# Patient Record
Sex: Male | Born: 1980 | Race: White | Hispanic: No | Marital: Single | Smoking: Former smoker
Health system: Southern US, Community
[De-identification: ages and names within clinical notes are randomized; demographics above are authoritative.]

## PROBLEM LIST (undated history)

## (undated) DIAGNOSIS — M069 Rheumatoid arthritis, unspecified: Secondary | ICD-10-CM

## (undated) DIAGNOSIS — M109 Gout, unspecified: Secondary | ICD-10-CM

## (undated) HISTORY — PX: HERNIA REPAIR: SHX51

## (undated) HISTORY — PX: WISDOM TOOTH EXTRACTION: SHX21

---

## 2013-02-07 ENCOUNTER — Encounter (HOSPITAL_COMMUNITY): Payer: Self-pay | Admitting: *Deleted

## 2013-02-07 ENCOUNTER — Emergency Department (HOSPITAL_COMMUNITY)
Admission: EM | Admit: 2013-02-07 | Discharge: 2013-02-07 | Disposition: A | Discharge status: Home / Self Care | Payer: Medicaid Other | Attending: Emergency Medicine | Admitting: Emergency Medicine

## 2013-02-07 DIAGNOSIS — F17200 Nicotine dependence, unspecified, uncomplicated: Secondary | ICD-10-CM | POA: Insufficient documentation

## 2013-02-07 DIAGNOSIS — R220 Localized swelling, mass and lump, head: Secondary | ICD-10-CM | POA: Insufficient documentation

## 2013-02-07 DIAGNOSIS — K089 Disorder of teeth and supporting structures, unspecified: Secondary | ICD-10-CM | POA: Insufficient documentation

## 2013-02-07 DIAGNOSIS — Z880 Allergy status to penicillin: Secondary | ICD-10-CM | POA: Insufficient documentation

## 2013-02-07 DIAGNOSIS — K0889 Other specified disorders of teeth and supporting structures: Secondary | ICD-10-CM

## 2013-02-07 DIAGNOSIS — R51 Headache: Secondary | ICD-10-CM | POA: Insufficient documentation

## 2013-02-07 MED ORDER — CEFTRIAXONE SODIUM 1 G IJ SOLR
1.0000 g | Freq: Once | INTRAMUSCULAR | Status: AC
  Administered 2013-02-07: 1 g via INTRAMUSCULAR
  Filled 2013-02-07: qty 10

## 2013-02-07 MED ORDER — OXYCODONE-ACETAMINOPHEN 5-325 MG PO TABS: 1.0000 | ORAL_TABLET | Freq: Four times a day (QID) | ORAL | Status: DC | PRN

## 2013-02-07 MED ORDER — ONDANSETRON HCL 4 MG PO TABS
4.0000 mg | ORAL_TABLET | Freq: Once | ORAL | Status: AC
  Administered 2013-02-07: 4 mg via ORAL
  Filled 2013-02-07: qty 1

## 2013-02-07 MED ORDER — DICLOFENAC SODIUM 75 MG PO TBEC: 75.0000 mg | DELAYED_RELEASE_TABLET | Freq: Two times a day (BID) | ORAL | Status: DC

## 2013-02-07 MED ORDER — LIDOCAINE HCL (PF) 1 % IJ SOLN
INTRAMUSCULAR | Status: AC
  Administered 2013-02-07: 5 mL
  Filled 2013-02-07: qty 5

## 2013-02-07 NOTE — ED Provider Notes (Signed)
Medical screening examination/treatment/procedure(s) were performed by non-physician practitioner and as supervising physician I was immediately available for consultation/collaboration.    Vida Roller, MD 02/07/13 [phone]

## 2013-02-07 NOTE — ED Provider Notes (Signed)
History     CSN: [id number]  Arrival date & time 02/07/13  1002   First MD Initiated Contact with Patient 02/07/13 1004      Chief Complaint  Patient presents with  . Dental Pain    (Consider location/radiation/quality/duration/timing/severity/associated sxs/prior treatment) Patient is a 32 y.o. male presenting with tooth pain. The history is provided by the patient.  Dental Pain Location:  Upper and lower Upper teeth location:  1/RU 3rd molar and 16/LU 3rd molar Lower teeth location:  17/LL 3rd molar and 32/RL 3rd molar Quality:  Throbbing Severity:  Severe Onset quality:  Gradual Duration:  2 days Timing:  Intermittent Progression:  Worsening Chronicity:  New Context comment:  Pt has 4 wisdom teeth removed on 5/23. Previous work-up:  Dental exam Relieved by:  Nothing Ineffective treatments: narcotic pain meds and antibiotics. Associated symptoms: facial swelling, gum swelling and headaches   Associated symptoms: no fever, no neck pain and no oral bleeding     History reviewed. No pertinent past medical history.  Past Surgical History  Procedure Laterality Date  . Wisdom tooth extraction      No family history on file.  History  Substance Use Topics  . Smoking status: Current Some Day Smoker  . Smokeless tobacco: Not on file  . Alcohol Use: No      Review of Systems  Constitutional: Negative for fever and activity change.       All ROS Neg except as noted in HPI  HENT: Positive for facial swelling. Negative for nosebleeds and neck pain.   Eyes: Negative for photophobia and discharge.  Respiratory: Negative for cough, shortness of breath and wheezing.   Cardiovascular: Negative for chest pain and palpitations.  Gastrointestinal: Negative for abdominal pain and blood in stool.  Genitourinary: Negative for dysuria, frequency and hematuria.  Musculoskeletal: Negative for back pain and arthralgias.  Skin: Negative.   Neurological: Positive for headaches.  Negative for dizziness, seizures and speech difficulty.  Psychiatric/Behavioral: Negative for hallucinations and confusion.    Allergies  Penicillins  Home Medications   Current Outpatient Rx  Name  Route  Sig  Dispense  Refill  . ibuprofen (ADVIL,MOTRIN) 200 MG tablet   Oral   Take 200 mg by mouth every 6 (six) hours as needed for pain.           BP 148/95  Pulse 81  Temp(Src) 98.1 F (36.7 C)  Resp 20  Ht 6\' 1"  (1.854 m)  Wt 230 lb (104.327 kg)  BMI 30.35 kg/m2  SpO2 100%  Physical Exam  Nursing note and vitals reviewed. Constitutional: He is oriented to person, place, and time. He appears well-developed and well-nourished.  Non-toxic appearance.  HENT:  Head: Normocephalic.  Right Ear: Tympanic membrane and external ear normal.  Left Ear: Tympanic membrane and external ear normal.  No facial asymmetry appreciated. No hot areas of the face appreciated. There are postoperative changes of the third molar areas. No visible abscess appreciated. No active drainage appreciated from the surgical sites. The areas are tender to touch. And the areas of the face on the right and left upper jaw are tender to palpation.  Eyes: EOM and lids are normal. Pupils are equal, round, and reactive to light.  Neck: Normal range of motion. Neck supple. Carotid bruit is not present.  Cardiovascular: Normal rate, regular rhythm, normal heart sounds, intact distal pulses and normal pulses.   Pulmonary/Chest: Breath sounds normal. No respiratory distress.  Abdominal: Soft. Bowel sounds are normal.  There is no tenderness. There is no guarding.  Musculoskeletal: Normal range of motion.  Lymphadenopathy:       Head (right side): No submandibular adenopathy present.       Head (left side): No submandibular adenopathy present.    He has no cervical adenopathy.  Neurological: He is alert and oriented to person, place, and time. He has normal strength. No cranial nerve deficit or sensory deficit.   Skin: Skin is warm and dry.  Psychiatric: He has a normal mood and affect. His speech is normal.    ED Course  Procedures (including critical care time)  Labs Reviewed - No data to display No results found.   No diagnosis found.    MDM  I have reviewed nursing notes, vital signs, and all appropriate lab and imaging results for this patient. Patient had also wisdom tooth teeth removed on May 23. He continues to have pain mostly on the left side of his face. He has not had high fever. He has pain with attempting to 2. He has completed a course of clindamycin.  No swelling or reddened areas noted of the face. The post operative areas at the third molar regions are progressing nicely. No visible abscess appreciated.  The plan at this time is for the patient to receive an injection of Rocephin in the emergency department by her. A prescription for diclofenac and Percocet is given to the patient. Patient will stop the Norco for now. Patient is scheduled to see the dentist on Monday, June 2.       Kathie Dike, PA-C 02/07/13 1133

## 2013-02-07 NOTE — ED Notes (Signed)
Pt states that he had wisdom tooth removal surgery performed on Jan 30, 2013, continues to have pain and now left side facial swelling. Pt has spoken with oral surgeon who placed pt on antibiotics and hydrocodone 10/325mg , pt has finished his course of antibiotics two days ago., still has swelling, pain is not being controlled by pain medication he is currently taking.

## 2013-06-17 ENCOUNTER — Emergency Department (HOSPITAL_COMMUNITY)
Admission: EM | Admit: 2013-06-17 | Discharge: 2013-06-17 | Disposition: A | Discharge status: Home / Self Care | Payer: Medicare Other | Attending: Emergency Medicine | Admitting: Emergency Medicine

## 2013-06-17 ENCOUNTER — Encounter (HOSPITAL_COMMUNITY): Payer: Self-pay | Admitting: Emergency Medicine

## 2013-06-17 ENCOUNTER — Emergency Department (HOSPITAL_COMMUNITY): Payer: Medicare Other

## 2013-06-17 DIAGNOSIS — Z8739 Personal history of other diseases of the musculoskeletal system and connective tissue: Secondary | ICD-10-CM | POA: Insufficient documentation

## 2013-06-17 DIAGNOSIS — Z880 Allergy status to penicillin: Secondary | ICD-10-CM | POA: Diagnosis not present

## 2013-06-17 DIAGNOSIS — Z79899 Other long term (current) drug therapy: Secondary | ICD-10-CM | POA: Insufficient documentation

## 2013-06-17 DIAGNOSIS — K7689 Other specified diseases of liver: Secondary | ICD-10-CM | POA: Diagnosis not present

## 2013-06-17 DIAGNOSIS — R197 Diarrhea, unspecified: Secondary | ICD-10-CM | POA: Insufficient documentation

## 2013-06-17 DIAGNOSIS — R1031 Right lower quadrant pain: Secondary | ICD-10-CM | POA: Insufficient documentation

## 2013-06-17 DIAGNOSIS — R110 Nausea: Secondary | ICD-10-CM | POA: Insufficient documentation

## 2013-06-17 DIAGNOSIS — Z87891 Personal history of nicotine dependence: Secondary | ICD-10-CM | POA: Insufficient documentation

## 2013-06-17 DIAGNOSIS — R109 Unspecified abdominal pain: Secondary | ICD-10-CM

## 2013-06-17 HISTORY — DX: Rheumatoid arthritis, unspecified: M06.9

## 2013-06-17 LAB — URINALYSIS, ROUTINE W REFLEX MICROSCOPIC
Bilirubin Urine: NEGATIVE
Glucose, UA: NEGATIVE mg/dL
Hgb urine dipstick: NEGATIVE
Nitrite: NEGATIVE
Specific Gravity, Urine: 1.01 (ref 1.005–1.030)
pH: 7.5 (ref 5.0–8.0)

## 2013-06-17 LAB — COMPREHENSIVE METABOLIC PANEL
ALT: 23 U/L (ref 0–53)
AST: 16 U/L (ref 0–37)
Albumin: 4.1 g/dL (ref 3.5–5.2)
Calcium: 10.3 mg/dL (ref 8.4–10.5)
GFR calc Af Amer: >90 mL/min (ref >90)
Potassium: 3.5 mEq/L (ref 3.5–5.1)
Sodium: 138 mEq/L (ref 135–145)
Total Protein: 7.9 g/dL (ref 6.0–8.3)

## 2013-06-17 LAB — CBC WITH DIFFERENTIAL/PLATELET
Basophils Absolute: 0 10*3/uL (ref 0.0–0.1)
Basophils Relative: 0 % (ref 0–1)
Eosinophils Absolute: 0.1 10*3/uL (ref 0.0–0.7)
Eosinophils Relative: 1 % (ref 0–5)
MCH: 28.4 pg (ref 26.0–34.0)
MCV: 85.8 fL (ref 78.0–100.0)
Neutrophils Relative %: 68 % (ref 43–77)
Platelets: 381 10*3/uL (ref 150–400)
RDW: 13.5 % (ref 11.5–15.5)

## 2013-06-17 MED ORDER — HYDROMORPHONE HCL PF 1 MG/ML IJ SOLN
1.0000 mg | Freq: Once | INTRAMUSCULAR | Status: AC
  Administered 2013-06-17: 1 mg via INTRAVENOUS
  Filled 2013-06-17: qty 1

## 2013-06-17 MED ORDER — IOHEXOL 300 MG/ML  SOLN: 50.0000 mL | Freq: Once | INTRAMUSCULAR | Status: DC | PRN

## 2013-06-17 MED ORDER — PROMETHAZINE HCL 25 MG PO TABS: 25.0000 mg | ORAL_TABLET | Freq: Four times a day (QID) | ORAL | Status: AC | PRN

## 2013-06-17 MED ORDER — IOHEXOL 300 MG/ML  SOLN
100.0000 mL | Freq: Once | INTRAMUSCULAR | Status: AC | PRN
  Administered 2013-06-17: 100 mL via INTRAVENOUS

## 2013-06-17 MED ORDER — OXYCODONE-ACETAMINOPHEN 5-325 MG PO TABS: 2.0000 | ORAL_TABLET | ORAL | Status: AC | PRN

## 2013-06-17 MED ORDER — HYDROMORPHONE HCL PF 1 MG/ML IJ SOLN
0.5000 mg | Freq: Once | INTRAMUSCULAR | Status: AC
  Administered 2013-06-17: 22:00:00 via INTRAVENOUS
  Filled 2013-06-17: qty 1

## 2013-06-17 MED ORDER — SODIUM CHLORIDE 0.9 % IV BOLUS (SEPSIS)
1000.0000 mL | Freq: Once | INTRAVENOUS | Status: AC
  Administered 2013-06-17: 1000 mL via INTRAVENOUS

## 2013-06-17 MED ORDER — ONDANSETRON HCL 4 MG/2ML IJ SOLN
4.0000 mg | Freq: Once | INTRAMUSCULAR | Status: AC
  Administered 2013-06-17: 4 mg via INTRAVENOUS
  Filled 2013-06-17: qty 2

## 2013-06-17 MED ORDER — IOHEXOL 300 MG/ML  SOLN
50.0000 mL | Freq: Once | INTRAMUSCULAR | Status: AC | PRN
  Administered 2013-06-17: 50 mL via ORAL

## 2013-06-17 NOTE — ED Notes (Signed)
Pt c/o RLQ pain x2-3 days. Pt states pain started as cramping pain and is now sharp. Pt also reports nausea and diarrhea but denies vomiting. Pt also states "I get slight relief when I lay flat".

## 2013-06-17 NOTE — ED Notes (Signed)
RLQ cramping x 3-4 days with n/d.  Reports Sharp pain in RLQ starting today.  Denies PO intake.  Denies vomiting.

## 2013-06-19 NOTE — ED Provider Notes (Signed)
CSN: [id number]     Arrival date & time 06/17/13  1702 History   First MD Initiated Contact with Patient 06/17/13 1717     Chief Complaint  Patient presents with  . Abdominal Pain   (Consider location/radiation/quality/duration/timing/severity/associated sxs/prior Treatment) HPI .... right lower quadrant pain for 3-4 days described as cramping with nausea, diarrhea.   No fever, sweats, chills, dysuria, hematuria. Severity is mild to moderate. Nothing makes symptoms better or worse. No radiation of pain.   Past Medical History  Diagnosis Date  . RA (rheumatoid arthritis)    Past Surgical History  Procedure Laterality Date  . Wisdom tooth extraction    . Hernia repair     No family history on file. History  Substance Use Topics  . Smoking status: Former Smoker    Quit date: 04/01/2013  . Smokeless tobacco: Not on file  . Alcohol Use: Yes     Comment: occasional    Review of Systems  All other systems reviewed and are negative.    Allergies  Penicillins  Home Medications   Current Outpatient Rx  Name  Route  Sig  Dispense  Refill  . folic acid (FOLVITE) 1 MG tablet   Oral   Take 1 mg by mouth daily.         Marland Kitchen ibuprofen (ADVIL,MOTRIN) 200 MG tablet   Oral   Take 200 mg by mouth every 6 (six) hours as needed for pain.         . methotrexate (RHEUMATREX) 2.5 MG tablet   Oral   Take 12.5 mg by mouth once a week. Caution:Chemotherapy. Protect from light.         Marland Kitchen oxyCODONE-acetaminophen (PERCOCET) 5-325 MG per tablet   Oral   Take 2 tablets by mouth every 4 (four) hours as needed for pain.   15 tablet   0   . promethazine (PHENERGAN) 25 MG tablet   Oral   Take 1 tablet (25 mg total) by mouth every 6 (six) hours as needed for nausea.   15 tablet   0    BP 161/104  Pulse 87  Temp(Src) 98 F (36.7 C) (Oral)  Resp 18  Ht 6\' 1"  (1.854 m)  Wt 245 lb (111.131 kg)  BMI 32.33 kg/m2  SpO2 100% Physical Exam  Nursing note and vitals  reviewed. Constitutional: He is oriented to person, place, and time. He appears well-developed and well-nourished.  HENT:  Head: Normocephalic and atraumatic.  Eyes: Conjunctivae and EOM are normal. Pupils are equal, round, and reactive to light.  Neck: Normal range of motion. Neck supple.  Cardiovascular: Normal rate, regular rhythm and normal heart sounds.   Pulmonary/Chest: Effort normal and breath sounds normal.  Abdominal: Soft. Bowel sounds are normal.  Tender right lower quadrant  Musculoskeletal: Normal range of motion.  Neurological: He is alert and oriented to person, place, and time.  Skin: Skin is warm and dry.  Psychiatric: He has a normal mood and affect.    ED Course  Procedures (including critical care time) Labs Review Labs Reviewed  COMPREHENSIVE METABOLIC PANEL - Abnormal; Notable for the following:    Glucose, Bld 106 (*)    All other components within normal limits  CBC WITH DIFFERENTIAL  URINALYSIS, ROUTINE W REFLEX MICROSCOPIC   Imaging Review No results found.  EKG Interpretation   None     No results found. White count, urinalysis, CT scan of abdomen pelvis all negative. MDM   1. Abdominal pain  White count normal. Urinalysis normal. CT abdomen pelvis shows no acute pathology. No acute abdomen at discharge. Discharge meds Percocet and Phenergan 25 mg    Donnetta Hutching, MD 06/19/13 2246

## 2013-07-12 DIAGNOSIS — M19049 Primary osteoarthritis, unspecified hand: Secondary | ICD-10-CM | POA: Diagnosis not present

## 2013-08-05 DIAGNOSIS — M109 Gout, unspecified: Secondary | ICD-10-CM | POA: Diagnosis not present

## 2013-10-12 DIAGNOSIS — M1990 Unspecified osteoarthritis, unspecified site: Secondary | ICD-10-CM | POA: Diagnosis not present

## 2013-10-12 DIAGNOSIS — M25569 Pain in unspecified knee: Secondary | ICD-10-CM | POA: Diagnosis not present

## 2013-10-12 DIAGNOSIS — M109 Gout, unspecified: Secondary | ICD-10-CM | POA: Diagnosis not present

## 2013-10-29 DIAGNOSIS — Z880 Allergy status to penicillin: Secondary | ICD-10-CM | POA: Diagnosis not present

## 2013-10-29 DIAGNOSIS — M1990 Unspecified osteoarthritis, unspecified site: Secondary | ICD-10-CM | POA: Diagnosis not present

## 2013-10-29 DIAGNOSIS — M109 Gout, unspecified: Secondary | ICD-10-CM | POA: Diagnosis not present

## 2013-10-29 DIAGNOSIS — G8929 Other chronic pain: Secondary | ICD-10-CM | POA: Diagnosis not present

## 2013-10-29 DIAGNOSIS — M25569 Pain in unspecified knee: Secondary | ICD-10-CM | POA: Diagnosis not present

## 2013-10-29 DIAGNOSIS — M069 Rheumatoid arthritis, unspecified: Secondary | ICD-10-CM | POA: Diagnosis not present

## 2013-10-29 DIAGNOSIS — Z79899 Other long term (current) drug therapy: Secondary | ICD-10-CM | POA: Diagnosis not present

## 2013-12-09 DIAGNOSIS — M1990 Unspecified osteoarthritis, unspecified site: Secondary | ICD-10-CM | POA: Diagnosis not present

## 2013-12-09 DIAGNOSIS — M25569 Pain in unspecified knee: Secondary | ICD-10-CM | POA: Diagnosis not present

## 2013-12-09 DIAGNOSIS — M109 Gout, unspecified: Secondary | ICD-10-CM | POA: Diagnosis not present

## 2014-04-14 ENCOUNTER — Emergency Department (HOSPITAL_COMMUNITY)
Admission: EM | Admit: 2014-04-14 | Discharge: 2014-04-14 | Disposition: A | Discharge status: Home / Self Care | Payer: Medicare Other | Attending: Emergency Medicine | Admitting: Emergency Medicine

## 2014-04-14 ENCOUNTER — Encounter (HOSPITAL_COMMUNITY): Payer: Self-pay | Admitting: Emergency Medicine

## 2014-04-14 ENCOUNTER — Emergency Department (HOSPITAL_COMMUNITY): Payer: Medicare Other

## 2014-04-14 DIAGNOSIS — S39012A Strain of muscle, fascia and tendon of lower back, initial encounter: Secondary | ICD-10-CM

## 2014-04-14 DIAGNOSIS — T07XXXA Unspecified multiple injuries, initial encounter: Secondary | ICD-10-CM

## 2014-04-14 DIAGNOSIS — Y929 Unspecified place or not applicable: Secondary | ICD-10-CM | POA: Diagnosis not present

## 2014-04-14 DIAGNOSIS — Z79899 Other long term (current) drug therapy: Secondary | ICD-10-CM | POA: Insufficient documentation

## 2014-04-14 DIAGNOSIS — W108XXA Fall (on) (from) other stairs and steps, initial encounter: Secondary | ICD-10-CM | POA: Diagnosis not present

## 2014-04-14 DIAGNOSIS — Z87891 Personal history of nicotine dependence: Secondary | ICD-10-CM | POA: Insufficient documentation

## 2014-04-14 DIAGNOSIS — Y9389 Activity, other specified: Secondary | ICD-10-CM | POA: Insufficient documentation

## 2014-04-14 DIAGNOSIS — M109 Gout, unspecified: Secondary | ICD-10-CM | POA: Insufficient documentation

## 2014-04-14 DIAGNOSIS — M069 Rheumatoid arthritis, unspecified: Secondary | ICD-10-CM | POA: Insufficient documentation

## 2014-04-14 DIAGNOSIS — S335XXA Sprain of ligaments of lumbar spine, initial encounter: Secondary | ICD-10-CM | POA: Insufficient documentation

## 2014-04-14 DIAGNOSIS — Z880 Allergy status to penicillin: Secondary | ICD-10-CM | POA: Insufficient documentation

## 2014-04-14 DIAGNOSIS — IMO0002 Reserved for concepts with insufficient information to code with codable children: Secondary | ICD-10-CM | POA: Diagnosis not present

## 2014-04-14 DIAGNOSIS — M545 Low back pain, unspecified: Secondary | ICD-10-CM | POA: Diagnosis not present

## 2014-04-14 HISTORY — DX: Gout, unspecified: M10.9

## 2014-04-14 MED ORDER — DIAZEPAM 5 MG PO TABS
10.0000 mg | ORAL_TABLET | Freq: Once | ORAL | Status: AC
  Administered 2014-04-14: 10 mg via ORAL
  Filled 2014-04-14: qty 2

## 2014-04-14 MED ORDER — IBUPROFEN 800 MG PO TABS
800.0000 mg | ORAL_TABLET | Freq: Once | ORAL | Status: AC
  Administered 2014-04-14: 800 mg via ORAL
  Filled 2014-04-14: qty 1

## 2014-04-14 MED ORDER — IBUPROFEN 800 MG PO TABS: 800.0000 mg | ORAL_TABLET | Freq: Three times a day (TID) | ORAL | Status: AC

## 2014-04-14 MED ORDER — DIAZEPAM 5 MG PO TABS: 5.0000 mg | ORAL_TABLET | Freq: Three times a day (TID) | ORAL | Status: AC

## 2014-04-14 NOTE — ED Notes (Signed)
Pt c/o tenderness to lower back after falling today and back hit edge of cement step, abrasions noted to forehead x2 and tip of nose, pt denies LOC

## 2014-04-14 NOTE — ED Provider Notes (Signed)
CSN: [id number]     Arrival date & time 04/14/14  2041 History   First MD Initiated Contact with Patient 04/14/14 2126     Chief Complaint  Patient presents with  . Fall     (Consider location/radiation/quality/duration/timing/severity/associated sxs/prior Treatment) HPI Comments: Patient is a 33 year old male who presents to the emergency department with complaint of back pain following a fall. The patient states that earlier today he fell down 4 or 5 steps. He sustained some abrasions to the face, but mostly has pain of the lower back. He states he has not had any previous problems in involving the back. He has not had any operations involving the back. It is of note that the patient is treated for gout and rheumatoid arthritis. He uses a cane to ambulate. He denies any other injuries at this time.  Patient is a 33 y.o. male presenting with fall. The history is provided by the patient.  Fall Associated symptoms include arthralgias. Pertinent negatives include no abdominal pain, chest pain, coughing or neck pain.    Past Medical History  Diagnosis Date  . RA (rheumatoid arthritis)   . Gout    Past Surgical History  Procedure Laterality Date  . Wisdom tooth extraction    . Hernia repair     No family history on file. History  Substance Use Topics  . Smoking status: Former Smoker    Quit date: 04/01/2013  . Smokeless tobacco: Not on file  . Alcohol Use: Yes     Comment: occasional    Review of Systems  Constitutional: Negative for activity change.       All ROS Neg except as noted in HPI  HENT: Negative for nosebleeds.   Eyes: Negative for photophobia and discharge.  Respiratory: Negative for cough, shortness of breath and wheezing.   Cardiovascular: Negative for chest pain and palpitations.  Gastrointestinal: Negative for abdominal pain and blood in stool.  Genitourinary: Negative for dysuria, frequency and hematuria.  Musculoskeletal: Positive for arthralgias. Negative  for back pain and neck pain.  Skin: Negative.   Neurological: Negative for dizziness, seizures and speech difficulty.  Psychiatric/Behavioral: Negative for hallucinations and confusion.      Allergies  Penicillins  Home Medications   Prior to Admission medications   Medication Sig Start Date End Date Taking? Authorizing Provider  folic acid (FOLVITE) 1 MG tablet Take 1 mg by mouth daily.    Historical Provider, MD  ibuprofen (ADVIL,MOTRIN) 200 MG tablet Take 200 mg by mouth every 6 (six) hours as needed for pain.    Historical Provider, MD  methotrexate (RHEUMATREX) 2.5 MG tablet Take 12.5 mg by mouth once a week. Caution:Chemotherapy. Protect from light.    Historical Provider, MD  oxyCODONE-acetaminophen (PERCOCET) 5-325 MG per tablet Take 2 tablets by mouth every 4 (four) hours as needed for pain. 06/17/13   Donnetta Hutching, MD  promethazine (PHENERGAN) 25 MG tablet Take 1 tablet (25 mg total) by mouth every 6 (six) hours as needed for nausea. 06/17/13   Donnetta Hutching, MD   BP 156/97  Pulse 116  Temp(Src) 98.1 F (36.7 C) (Oral)  Resp 20  Ht 6\' 1"  (1.854 m)  Wt 270 lb (122.471 kg)  BMI 35.63 kg/m2  SpO2 98% Physical Exam  Nursing note and vitals reviewed. Constitutional: He is oriented to person, place, and time. He appears well-developed and well-nourished.  Non-toxic appearance.  HENT:  Head: Normocephalic.  Right Ear: Tympanic membrane and external ear normal.  Left Ear: Tympanic  membrane and external ear normal.  There are abrasions involving the face.   Eyes: EOM and lids are normal. Pupils are equal, round, and reactive to light.  Neck: Normal range of motion. Neck supple. Carotid bruit is not present.  Cardiovascular: Normal rate, regular rhythm, normal heart sounds, intact distal pulses and normal pulses.   Pulmonary/Chest: Breath sounds normal. No respiratory distress.  There is symmetrical rise and fall of the chest. The patient speaks in complete sentences. No palpable  deformity of the ribs or chest area.  Abdominal: Soft. Bowel sounds are normal. There is no tenderness. There is no guarding.  Musculoskeletal: Normal range of motion.  There is pain to palpation and with change of position of the lower lumbar region. No palpable step off appreciated. There is pain of the paraspinal lower lumbar region.  Lymphadenopathy:       Head (right side): No submandibular adenopathy present.       Head (left side): No submandibular adenopathy present.    He has no cervical adenopathy.  Neurological: He is alert and oriented to person, place, and time. He has normal strength. No cranial nerve deficit or sensory deficit.  Skin: Skin is warm and dry.  Psychiatric: He has a normal mood and affect. His speech is normal.    ED Course  Procedures (including critical care time) Labs Review Labs Reviewed - No data to display  Imaging Review Dg Lumbar Spine Complete  04/14/2014   CLINICAL DATA:  Low back pain after a fall today.  EXAM: LUMBAR SPINE - COMPLETE 4+ VIEW  COMPARISON:  CT scan of the abdomen dated 06/17/2013  FINDINGS: There is no evidence of lumbar spine fracture. Alignment is normal. Intervertebral disc spaces are maintained. No facet arthritis.  IMPRESSION: Normal exam.   Electronically Signed   By: Geanie Cooley M.D.   On: 04/14/2014 21:52     EKG Interpretation None      MDM X-ray of the lumbar spine is negative for fracture. The prevertebral spaces are maintained.  Results of the x-rays have been given to the patient. I suggested to the patient to use an ice pack to the area. He will be treated with muscle relaxer. Patient states he uses ibuprofen at home for his pain. Patient advised to continue the ibuprofen. He is to follow up with his primary physician if any changes, problems, or concerns.    Final diagnoses:  None    *I have reviewed nursing notes, vital signs, and all appropriate lab and imaging results for this patient.Kathie Dike, PA-C 04/14/14 2215

## 2014-04-14 NOTE — ED Notes (Signed)
Pt states he fell down 4-5 steps today and now c/o back pain.

## 2014-04-14 NOTE — Discharge Instructions (Signed)
Your x-rays are negative for fracture or dislocation. Please apply ice to your lower back tonight. Please began using heat tomorrow night. Use of Valium 3 times daily for spasm. Please use caution as this medication may cause drowsiness. Use ibuprofen 3 times daily for swelling and inflammation. Please take this medication with food. Please see your primary physician for additional evaluation and management. Lumbosacral Strain Lumbosacral strain is a strain of any of the parts that make up your lumbosacral vertebrae. Your lumbosacral vertebrae are the bones that make up the lower third of your backbone. Your lumbosacral vertebrae are held together by muscles and tough, fibrous tissue (ligaments).  CAUSES  A sudden blow to your back can cause lumbosacral strain. Also, anything that causes an excessive stretch of the muscles in the low back can cause this strain. This is typically seen when people exert themselves strenuously, fall, lift heavy objects, bend, or crouch repeatedly. RISK FACTORS  Physically demanding work.  Participation in pushing or pulling sports or sports that require a sudden twist of the back (tennis, golf, baseball).  Weight lifting.  Excessive lower back curvature.  Forward-tilted pelvis.  Weak back or abdominal muscles or both.  Tight hamstrings. SIGNS AND SYMPTOMS  Lumbosacral strain may cause pain in the area of your injury or pain that moves (radiates) down your leg.  DIAGNOSIS Your health care provider can often diagnose lumbosacral strain through a physical exam. In some cases, you may need tests such as X-ray exams.  TREATMENT  Treatment for your lower back injury depends on many factors that your clinician will have to evaluate. However, most treatment will include the use of anti-inflammatory medicines. HOME CARE INSTRUCTIONS   Avoid hard physical activities (tennis, racquetball, waterskiing) if you are not in proper physical condition for it. This may  aggravate or create problems.  If you have a back problem, avoid sports requiring sudden body movements. Swimming and walking are generally safer activities.  Maintain good posture.  Maintain a healthy weight.  For acute conditions, you may put ice on the injured area.  Put ice in a plastic bag.  Place a towel between your skin and the bag.  Leave the ice on for 20 minutes, 2-3 times a day.  When the low back starts healing, stretching and strengthening exercises may be recommended. SEEK MEDICAL CARE IF:  Your back pain is getting worse.  You experience severe back pain not relieved with medicines. SEEK IMMEDIATE MEDICAL CARE IF:   You have numbness, tingling, weakness, or problems with the use of your arms or legs.  There is a change in bowel or bladder control.  You have increasing pain in any area of the body, including your belly (abdomen).  You notice shortness of breath, dizziness, or feel faint.  You feel sick to your stomach (nauseous), are throwing up (vomiting), or become sweaty.  You notice discoloration of your toes or legs, or your feet get very cold. MAKE SURE YOU:   Understand these instructions.  Will watch your condition.  Will get help right away if you are not doing well or get worse. Document Released: 06/06/2005 Document Revised: 09/01/2013 Document Reviewed: 04/15/2013 [facility] Patient Information 2015 [place], [place]. This information is not intended to replace advice given to you by your health care provider. Make sure you discuss any questions you have with your health care provider.

## 2014-04-15 NOTE — ED Provider Notes (Signed)
Medical screening examination/treatment/procedure(s) were performed by non-physician practitioner and as supervising physician I was immediately available for consultation/collaboration.   EKG Interpretation None        Purvis Sheffield, MD 04/15/14 1227

## 2014-05-07 DIAGNOSIS — M1990 Unspecified osteoarthritis, unspecified site: Secondary | ICD-10-CM | POA: Diagnosis not present

## 2014-05-07 DIAGNOSIS — M25569 Pain in unspecified knee: Secondary | ICD-10-CM | POA: Diagnosis not present

## 2014-05-07 DIAGNOSIS — M109 Gout, unspecified: Secondary | ICD-10-CM | POA: Diagnosis not present

## 2014-06-03 DIAGNOSIS — Z880 Allergy status to penicillin: Secondary | ICD-10-CM | POA: Diagnosis not present

## 2014-06-03 DIAGNOSIS — M069 Rheumatoid arthritis, unspecified: Secondary | ICD-10-CM | POA: Diagnosis not present

## 2014-06-03 DIAGNOSIS — M25569 Pain in unspecified knee: Secondary | ICD-10-CM | POA: Diagnosis not present

## 2014-06-03 DIAGNOSIS — G8929 Other chronic pain: Secondary | ICD-10-CM | POA: Diagnosis not present

## 2014-06-03 DIAGNOSIS — M1990 Unspecified osteoarthritis, unspecified site: Secondary | ICD-10-CM | POA: Diagnosis not present

## 2014-06-03 DIAGNOSIS — Z888 Allergy status to other drugs, medicaments and biological substances status: Secondary | ICD-10-CM | POA: Diagnosis not present

## 2014-06-03 DIAGNOSIS — Z809 Family history of malignant neoplasm, unspecified: Secondary | ICD-10-CM | POA: Diagnosis not present

## 2014-06-03 DIAGNOSIS — M109 Gout, unspecified: Secondary | ICD-10-CM | POA: Diagnosis not present

## 2014-06-03 DIAGNOSIS — Z79899 Other long term (current) drug therapy: Secondary | ICD-10-CM | POA: Diagnosis not present

## 2014-07-07 DIAGNOSIS — M109 Gout, unspecified: Secondary | ICD-10-CM | POA: Diagnosis not present

## 2014-07-07 DIAGNOSIS — M25561 Pain in right knee: Secondary | ICD-10-CM | POA: Diagnosis not present

## 2014-07-07 DIAGNOSIS — M1990 Unspecified osteoarthritis, unspecified site: Secondary | ICD-10-CM | POA: Diagnosis not present

## 2014-07-07 DIAGNOSIS — M25562 Pain in left knee: Secondary | ICD-10-CM | POA: Diagnosis not present

## 2014-07-19 DIAGNOSIS — R936 Abnormal findings on diagnostic imaging of limbs: Secondary | ICD-10-CM | POA: Diagnosis not present

## 2014-07-19 DIAGNOSIS — M898X6 Other specified disorders of bone, lower leg: Secondary | ICD-10-CM | POA: Diagnosis not present

## 2014-07-19 DIAGNOSIS — R600 Localized edema: Secondary | ICD-10-CM | POA: Diagnosis not present

## 2014-07-19 DIAGNOSIS — M25562 Pain in left knee: Secondary | ICD-10-CM | POA: Diagnosis not present

## 2014-07-19 DIAGNOSIS — R609 Edema, unspecified: Secondary | ICD-10-CM | POA: Diagnosis not present

## 2014-07-19 DIAGNOSIS — M899 Disorder of bone, unspecified: Secondary | ICD-10-CM | POA: Diagnosis not present

## 2014-07-27 DIAGNOSIS — M05749 Rheumatoid arthritis with rheumatoid factor of unspecified hand without organ or systems involvement: Secondary | ICD-10-CM | POA: Diagnosis not present

## 2014-07-27 DIAGNOSIS — F410 Panic disorder [episodic paroxysmal anxiety] without agoraphobia: Secondary | ICD-10-CM | POA: Diagnosis not present

## 2014-09-13 DIAGNOSIS — M109 Gout, unspecified: Secondary | ICD-10-CM | POA: Diagnosis not present

## 2014-09-13 DIAGNOSIS — M2342 Loose body in knee, left knee: Secondary | ICD-10-CM | POA: Diagnosis not present

## 2014-10-06 DIAGNOSIS — M109 Gout, unspecified: Secondary | ICD-10-CM | POA: Diagnosis not present

## 2014-10-06 DIAGNOSIS — M25561 Pain in right knee: Secondary | ICD-10-CM | POA: Diagnosis not present

## 2014-10-06 DIAGNOSIS — M1990 Unspecified osteoarthritis, unspecified site: Secondary | ICD-10-CM | POA: Diagnosis not present

## 2014-10-06 DIAGNOSIS — M25562 Pain in left knee: Secondary | ICD-10-CM | POA: Diagnosis not present

## 2014-11-11 IMAGING — CT CT ABD-PELV W/ CM
2 of 4 series · 17 of 46 positions shown, 19 images · IV contrast (Omnipaque 300)
Comparison: None.

CLINICAL DATA: Right lower quadrant pain

EXAM:
CT ABDOMEN AND PELVIS WITH CONTRAST
TECHNIQUE: Multidetector CT imaging of the abdomen and pelvis was performed
using the standard protocol following bolus administration of
intravenous contrast.
CONTRAST:  50mL OMNIPAQUE IOHEXOL 300 MG/ML SOLN, 100mL OMNIPAQUE
IOHEXOL 300 MG/ML SOLN

[Series 2: abd_pel_with 5.0 b40f · axial · 0.91mm/px · z∈[-482,-22]mm · 14 of 102 slices shown, 16 images]
[im 5/102  soft-tissue]
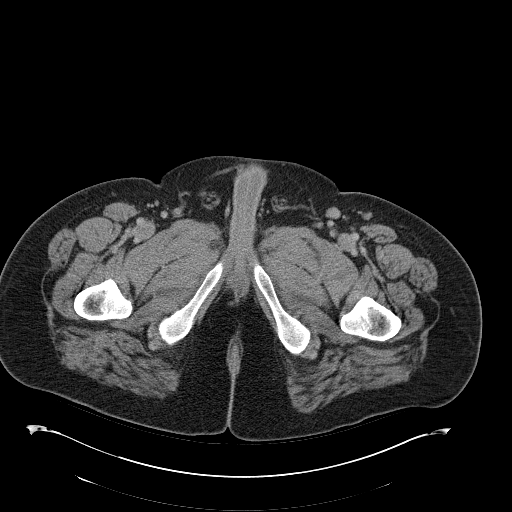
[im 5/102  bone]
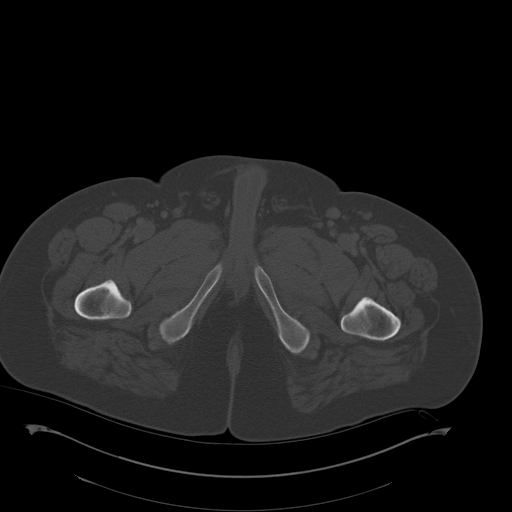
[im 13/102  soft-tissue]
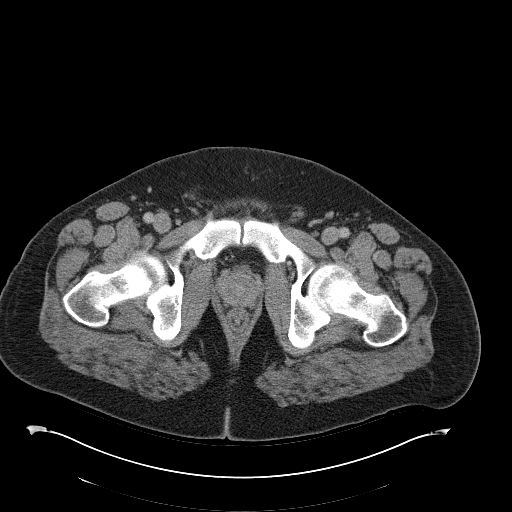
[im 21/102  soft-tissue]
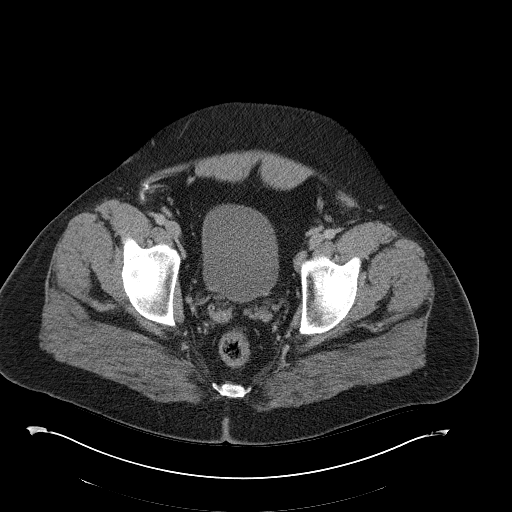
[im 29/102  soft-tissue]
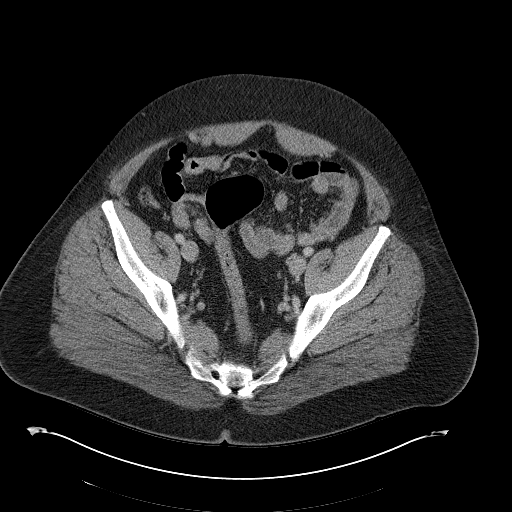
[im 33/102  soft-tissue]
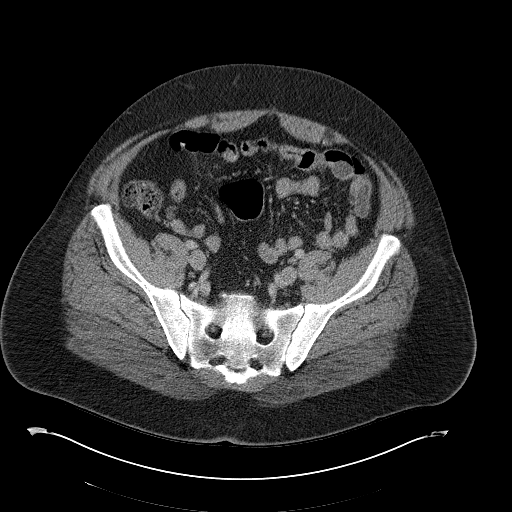
[im 41/102  soft-tissue]
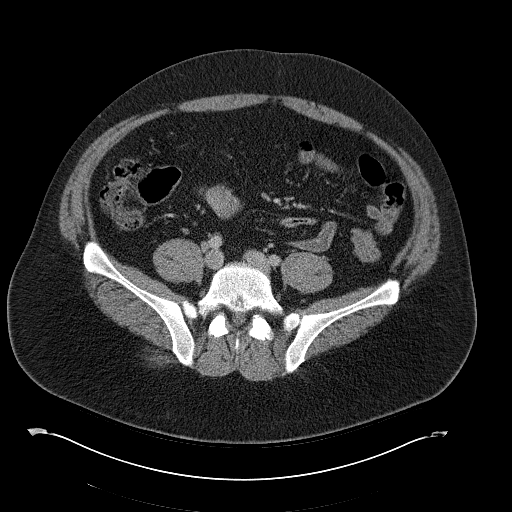
[im 49/102  soft-tissue]
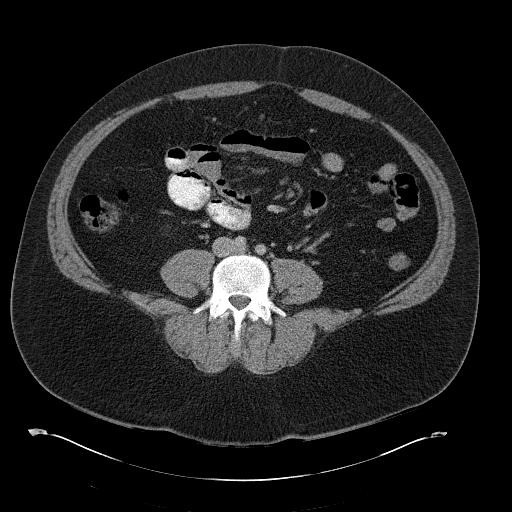
[im 53/102  soft-tissue]
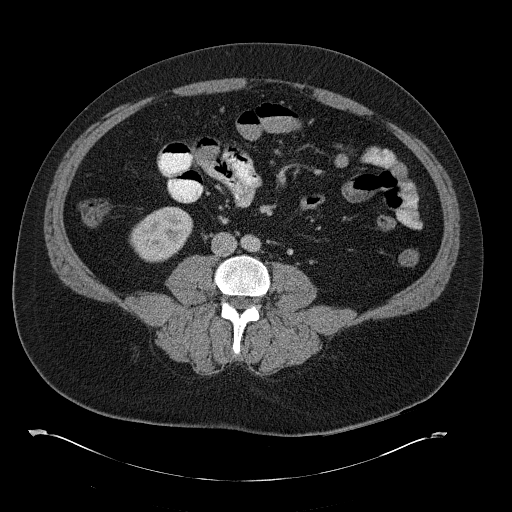
[im 61/102  soft-tissue]
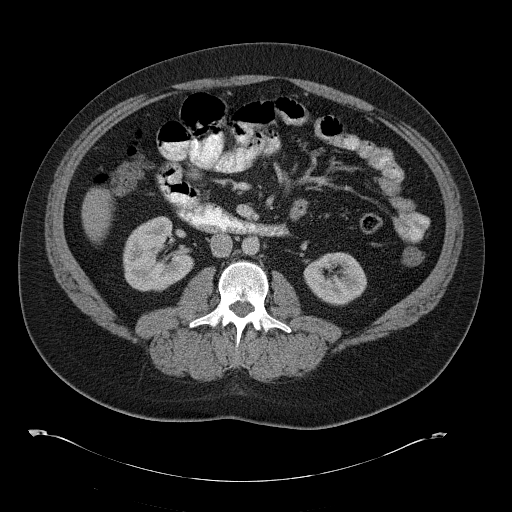
[im 61/102  bone]
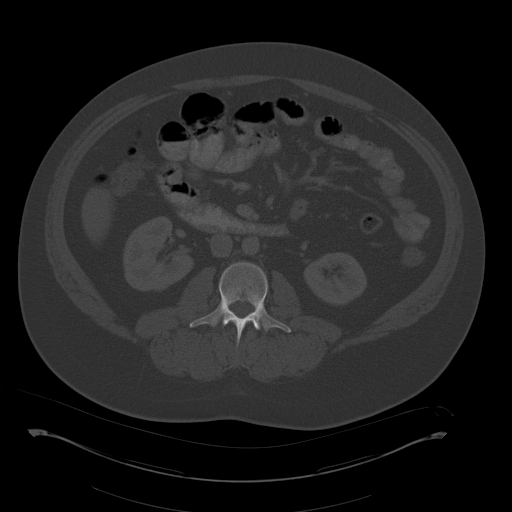
[im 69/102  soft-tissue]
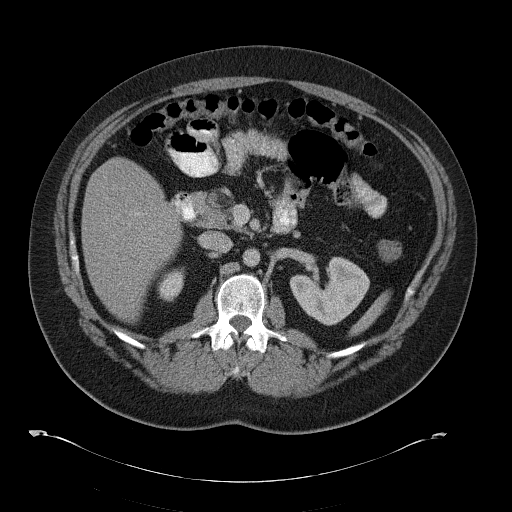
[im 77/102  soft-tissue]
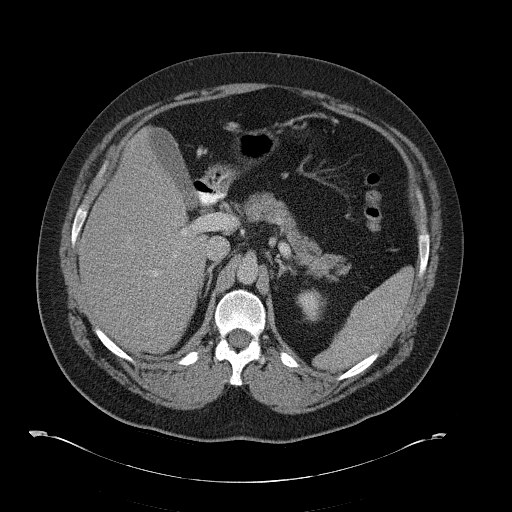
[im 81/102  soft-tissue]
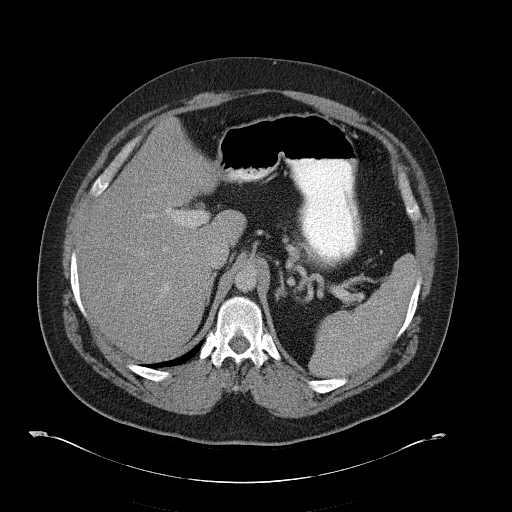
[im 89/102  soft-tissue]
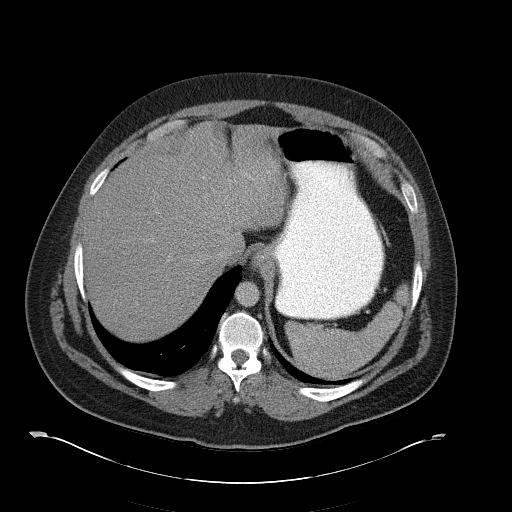
[im 97/102  soft-tissue]
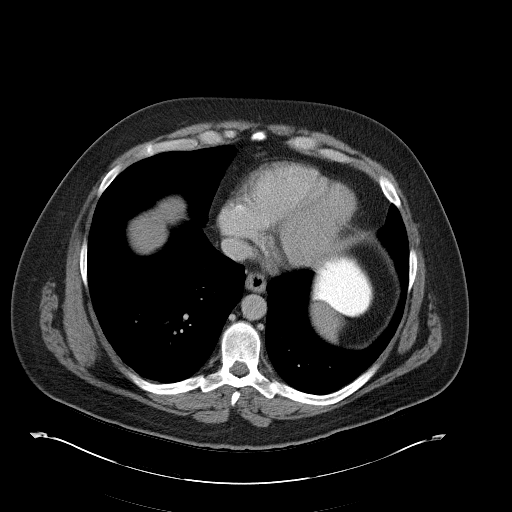

[Series 4: abd_pel_with 3.0 spo cor · coronal · 0.83mm/px · 3 of 115 slices shown]
[im 39/115  soft-tissue]
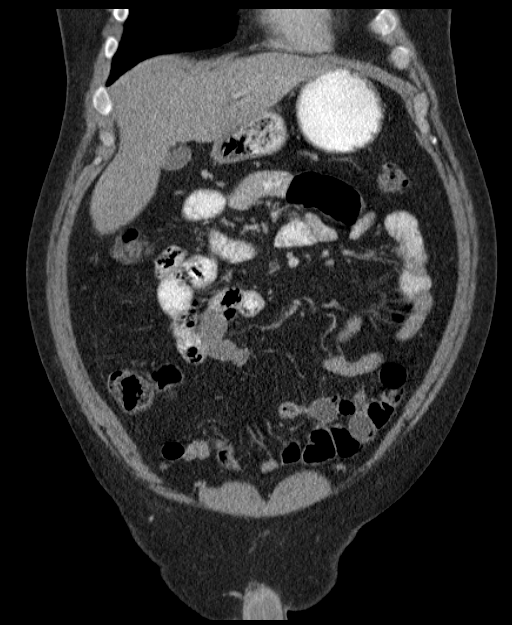
[im 51/115  soft-tissue]
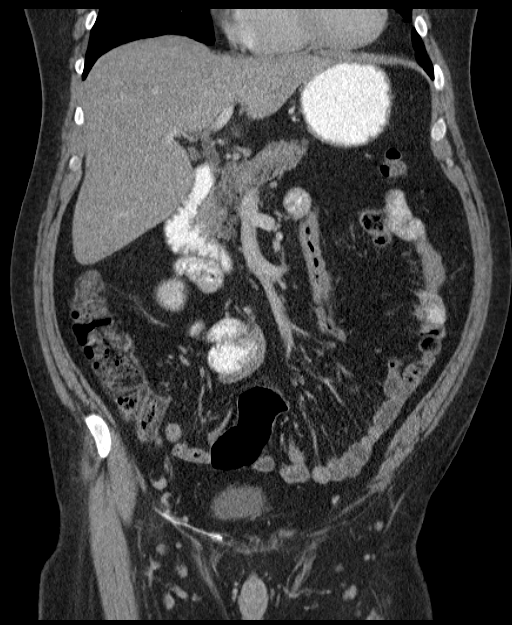
[im 64/115  soft-tissue]
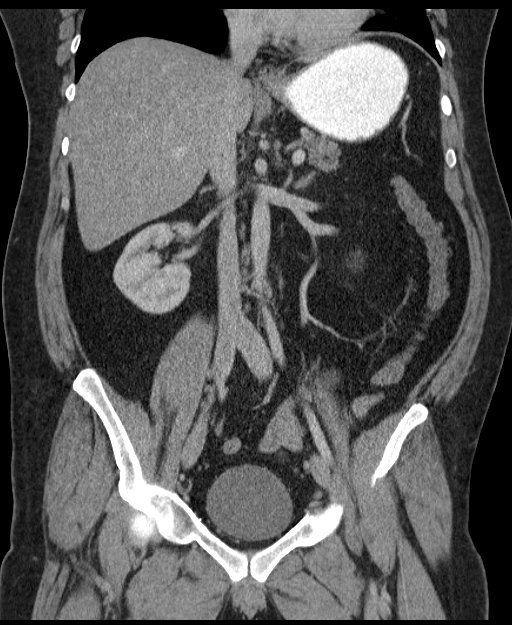

[17 of 46 positions shown; findings below may reference images not displayed]

FINDINGS: Normal appendix.

Diffuse hepatic steatosis.

Subsegmental atelectasis at the left lung base.

Spleen, pancreas, adrenal glands, and kidneys are within normal
limits. Gallbladder is decompressed.

Bladder and prostate are unremarkable.

No free-fluid. Status post right inguinal herniorrhaphy.

No destructive bone lesion or compression deformity. Minimal
degenerative change in the lumbar spine.
IMPRESSION: Normal appendix. No acute intra-abdominal or intrapelvic pathology.

## 2014-12-06 DIAGNOSIS — Z79891 Long term (current) use of opiate analgesic: Secondary | ICD-10-CM | POA: Diagnosis not present

## 2014-12-06 DIAGNOSIS — M25561 Pain in right knee: Secondary | ICD-10-CM | POA: Diagnosis not present

## 2014-12-06 DIAGNOSIS — M1990 Unspecified osteoarthritis, unspecified site: Secondary | ICD-10-CM | POA: Diagnosis not present

## 2014-12-06 DIAGNOSIS — M109 Gout, unspecified: Secondary | ICD-10-CM | POA: Diagnosis not present

## 2014-12-06 DIAGNOSIS — M25562 Pain in left knee: Secondary | ICD-10-CM | POA: Diagnosis not present

## 2015-03-08 DIAGNOSIS — M25561 Pain in right knee: Secondary | ICD-10-CM | POA: Diagnosis not present

## 2015-03-08 DIAGNOSIS — Z9289 Personal history of other medical treatment: Secondary | ICD-10-CM | POA: Diagnosis not present

## 2015-03-08 DIAGNOSIS — M1990 Unspecified osteoarthritis, unspecified site: Secondary | ICD-10-CM | POA: Diagnosis not present

## 2015-03-08 DIAGNOSIS — M25562 Pain in left knee: Secondary | ICD-10-CM | POA: Diagnosis not present

## 2015-05-24 DIAGNOSIS — G894 Chronic pain syndrome: Secondary | ICD-10-CM | POA: Diagnosis not present

## 2015-05-24 DIAGNOSIS — M1710 Unilateral primary osteoarthritis, unspecified knee: Secondary | ICD-10-CM | POA: Diagnosis not present

## 2015-05-24 DIAGNOSIS — Z79899 Other long term (current) drug therapy: Secondary | ICD-10-CM | POA: Diagnosis not present

## 2015-05-24 DIAGNOSIS — M25569 Pain in unspecified knee: Secondary | ICD-10-CM | POA: Diagnosis not present

## 2015-08-23 DIAGNOSIS — M545 Low back pain: Secondary | ICD-10-CM | POA: Diagnosis not present

## 2015-08-23 DIAGNOSIS — I10 Essential (primary) hypertension: Secondary | ICD-10-CM | POA: Diagnosis not present

## 2015-08-23 DIAGNOSIS — F410 Panic disorder [episodic paroxysmal anxiety] without agoraphobia: Secondary | ICD-10-CM | POA: Diagnosis not present

## 2015-09-21 DIAGNOSIS — M2550 Pain in unspecified joint: Secondary | ICD-10-CM | POA: Diagnosis not present

## 2015-09-21 DIAGNOSIS — M1009 Idiopathic gout, multiple sites: Secondary | ICD-10-CM | POA: Diagnosis not present

## 2015-09-21 DIAGNOSIS — G894 Chronic pain syndrome: Secondary | ICD-10-CM | POA: Diagnosis not present

## 2015-09-21 DIAGNOSIS — M545 Low back pain: Secondary | ICD-10-CM | POA: Diagnosis not present

## 2015-09-21 DIAGNOSIS — E661 Drug-induced obesity: Secondary | ICD-10-CM | POA: Diagnosis not present

## 2015-09-21 DIAGNOSIS — M069 Rheumatoid arthritis, unspecified: Secondary | ICD-10-CM | POA: Diagnosis not present

## 2015-09-21 DIAGNOSIS — G4733 Obstructive sleep apnea (adult) (pediatric): Secondary | ICD-10-CM | POA: Diagnosis not present

## 2015-10-19 DIAGNOSIS — M069 Rheumatoid arthritis, unspecified: Secondary | ICD-10-CM | POA: Diagnosis not present

## 2015-10-19 DIAGNOSIS — G4733 Obstructive sleep apnea (adult) (pediatric): Secondary | ICD-10-CM | POA: Diagnosis not present

## 2015-10-19 DIAGNOSIS — G894 Chronic pain syndrome: Secondary | ICD-10-CM | POA: Diagnosis not present

## 2015-10-19 DIAGNOSIS — M1009 Idiopathic gout, multiple sites: Secondary | ICD-10-CM | POA: Diagnosis not present

## 2015-10-19 DIAGNOSIS — Z79899 Other long term (current) drug therapy: Secondary | ICD-10-CM | POA: Diagnosis not present

## 2015-11-16 DIAGNOSIS — M545 Low back pain: Secondary | ICD-10-CM | POA: Diagnosis not present

## 2015-11-16 DIAGNOSIS — E661 Drug-induced obesity: Secondary | ICD-10-CM | POA: Diagnosis not present

## 2015-11-16 DIAGNOSIS — G894 Chronic pain syndrome: Secondary | ICD-10-CM | POA: Diagnosis not present

## 2015-11-16 DIAGNOSIS — M069 Rheumatoid arthritis, unspecified: Secondary | ICD-10-CM | POA: Diagnosis not present

## 2015-11-16 DIAGNOSIS — G4733 Obstructive sleep apnea (adult) (pediatric): Secondary | ICD-10-CM | POA: Diagnosis not present

## 2015-11-16 DIAGNOSIS — Z79899 Other long term (current) drug therapy: Secondary | ICD-10-CM | POA: Diagnosis not present

## 2015-11-16 DIAGNOSIS — M1009 Idiopathic gout, multiple sites: Secondary | ICD-10-CM | POA: Diagnosis not present

## 2015-12-13 DIAGNOSIS — G4733 Obstructive sleep apnea (adult) (pediatric): Secondary | ICD-10-CM | POA: Diagnosis not present

## 2015-12-28 DIAGNOSIS — M069 Rheumatoid arthritis, unspecified: Secondary | ICD-10-CM | POA: Diagnosis not present

## 2015-12-28 DIAGNOSIS — M545 Low back pain: Secondary | ICD-10-CM | POA: Diagnosis not present

## 2015-12-28 DIAGNOSIS — Z79899 Other long term (current) drug therapy: Secondary | ICD-10-CM | POA: Diagnosis not present

## 2015-12-28 DIAGNOSIS — G894 Chronic pain syndrome: Secondary | ICD-10-CM | POA: Diagnosis not present

## 2016-12-17 DIAGNOSIS — Z79899 Other long term (current) drug therapy: Secondary | ICD-10-CM | POA: Diagnosis not present

## 2016-12-17 DIAGNOSIS — M069 Rheumatoid arthritis, unspecified: Secondary | ICD-10-CM | POA: Diagnosis not present

## 2016-12-17 DIAGNOSIS — M545 Low back pain: Secondary | ICD-10-CM | POA: Diagnosis not present

## 2017-01-28 ENCOUNTER — Other Ambulatory Visit (HOSPITAL_COMMUNITY): Payer: Self-pay | Admitting: Neurology

## 2017-01-28 ENCOUNTER — Ambulatory Visit (HOSPITAL_COMMUNITY)
Admission: RE | Admit: 2017-01-28 | Discharge: 2017-01-28 | Disposition: A | Discharge status: Home / Self Care | Payer: Medicare Other | Source: Ambulatory Visit | Attending: Neurology | Admitting: Neurology

## 2017-01-28 DIAGNOSIS — T1490XA Injury, unspecified, initial encounter: Secondary | ICD-10-CM

## 2017-01-28 DIAGNOSIS — X58XXXA Exposure to other specified factors, initial encounter: Secondary | ICD-10-CM | POA: Insufficient documentation

## 2017-01-28 DIAGNOSIS — M25462 Effusion, left knee: Secondary | ICD-10-CM | POA: Diagnosis not present

## 2017-01-28 DIAGNOSIS — M1712 Unilateral primary osteoarthritis, left knee: Secondary | ICD-10-CM | POA: Diagnosis not present

## 2017-01-28 DIAGNOSIS — S8992XA Unspecified injury of left lower leg, initial encounter: Secondary | ICD-10-CM | POA: Diagnosis not present

## 2017-02-14 DIAGNOSIS — Z79891 Long term (current) use of opiate analgesic: Secondary | ICD-10-CM | POA: Diagnosis not present

## 2017-04-18 DIAGNOSIS — G894 Chronic pain syndrome: Secondary | ICD-10-CM | POA: Diagnosis not present

## 2017-04-18 DIAGNOSIS — Z79891 Long term (current) use of opiate analgesic: Secondary | ICD-10-CM | POA: Diagnosis not present

## 2017-04-18 DIAGNOSIS — M545 Low back pain: Secondary | ICD-10-CM | POA: Diagnosis not present

## 2017-04-18 DIAGNOSIS — M25562 Pain in left knee: Secondary | ICD-10-CM | POA: Diagnosis not present

## 2017-06-10 DIAGNOSIS — M109 Gout, unspecified: Secondary | ICD-10-CM | POA: Diagnosis not present

## 2017-06-10 DIAGNOSIS — R079 Chest pain, unspecified: Secondary | ICD-10-CM | POA: Diagnosis not present

## 2017-06-10 DIAGNOSIS — R0789 Other chest pain: Secondary | ICD-10-CM | POA: Diagnosis not present

## 2017-06-10 DIAGNOSIS — F17200 Nicotine dependence, unspecified, uncomplicated: Secondary | ICD-10-CM | POA: Diagnosis not present

## 2017-06-17 DIAGNOSIS — M25562 Pain in left knee: Secondary | ICD-10-CM | POA: Diagnosis not present

## 2017-06-17 DIAGNOSIS — G4709 Other insomnia: Secondary | ICD-10-CM | POA: Diagnosis not present

## 2017-06-17 DIAGNOSIS — M545 Low back pain: Secondary | ICD-10-CM | POA: Diagnosis not present

## 2017-06-17 DIAGNOSIS — G894 Chronic pain syndrome: Secondary | ICD-10-CM | POA: Diagnosis not present

## 2017-06-17 DIAGNOSIS — Z79899 Other long term (current) drug therapy: Secondary | ICD-10-CM | POA: Diagnosis not present

## 2017-08-08 DIAGNOSIS — G4709 Other insomnia: Secondary | ICD-10-CM | POA: Diagnosis not present

## 2017-08-08 DIAGNOSIS — M069 Rheumatoid arthritis, unspecified: Secondary | ICD-10-CM | POA: Diagnosis not present

## 2017-08-08 DIAGNOSIS — G894 Chronic pain syndrome: Secondary | ICD-10-CM | POA: Diagnosis not present

## 2017-08-08 DIAGNOSIS — Z79891 Long term (current) use of opiate analgesic: Secondary | ICD-10-CM | POA: Diagnosis not present

## 2017-08-08 DIAGNOSIS — M545 Low back pain: Secondary | ICD-10-CM | POA: Diagnosis not present

## 2017-09-06 ENCOUNTER — Other Ambulatory Visit (HOSPITAL_COMMUNITY): Payer: Self-pay | Admitting: Neurology

## 2017-09-06 ENCOUNTER — Ambulatory Visit (HOSPITAL_COMMUNITY)
Admission: RE | Admit: 2017-09-06 | Discharge: 2017-09-06 | Disposition: A | Discharge status: Home / Self Care | Payer: Medicare Other | Source: Ambulatory Visit | Attending: Neurology | Admitting: Neurology

## 2017-09-06 DIAGNOSIS — M545 Low back pain: Secondary | ICD-10-CM | POA: Diagnosis not present

## 2017-09-06 DIAGNOSIS — M5440 Lumbago with sciatica, unspecified side: Secondary | ICD-10-CM | POA: Diagnosis not present

## 2017-09-11 DIAGNOSIS — G4709 Other insomnia: Secondary | ICD-10-CM | POA: Diagnosis not present

## 2017-09-11 DIAGNOSIS — M25561 Pain in right knee: Secondary | ICD-10-CM | POA: Diagnosis not present

## 2017-09-11 DIAGNOSIS — M545 Low back pain: Secondary | ICD-10-CM | POA: Diagnosis not present

## 2017-09-11 DIAGNOSIS — Z79891 Long term (current) use of opiate analgesic: Secondary | ICD-10-CM | POA: Diagnosis not present

## 2017-11-07 DIAGNOSIS — G894 Chronic pain syndrome: Secondary | ICD-10-CM | POA: Diagnosis not present

## 2017-11-07 DIAGNOSIS — M069 Rheumatoid arthritis, unspecified: Secondary | ICD-10-CM | POA: Diagnosis not present

## 2017-11-07 DIAGNOSIS — Z79891 Long term (current) use of opiate analgesic: Secondary | ICD-10-CM | POA: Diagnosis not present

## 2017-11-07 DIAGNOSIS — M545 Low back pain: Secondary | ICD-10-CM | POA: Diagnosis not present

## 2017-11-07 DIAGNOSIS — G4709 Other insomnia: Secondary | ICD-10-CM | POA: Diagnosis not present

## 2017-12-27 DIAGNOSIS — Z79899 Other long term (current) drug therapy: Secondary | ICD-10-CM | POA: Diagnosis not present

## 2018-01-06 DIAGNOSIS — M1712 Unilateral primary osteoarthritis, left knee: Secondary | ICD-10-CM | POA: Diagnosis not present

## 2018-01-06 DIAGNOSIS — G8929 Other chronic pain: Secondary | ICD-10-CM | POA: Diagnosis not present

## 2018-01-06 DIAGNOSIS — Z6838 Body mass index (BMI) 38.0-38.9, adult: Secondary | ICD-10-CM | POA: Diagnosis not present

## 2018-01-06 DIAGNOSIS — M25562 Pain in left knee: Secondary | ICD-10-CM | POA: Diagnosis not present

## 2018-03-06 DIAGNOSIS — G8929 Other chronic pain: Secondary | ICD-10-CM | POA: Diagnosis not present

## 2018-03-06 DIAGNOSIS — F0631 Mood disorder due to known physiological condition with depressive features: Secondary | ICD-10-CM | POA: Diagnosis not present

## 2018-03-06 DIAGNOSIS — Z713 Dietary counseling and surveillance: Secondary | ICD-10-CM | POA: Diagnosis not present

## 2018-03-06 DIAGNOSIS — F17200 Nicotine dependence, unspecified, uncomplicated: Secondary | ICD-10-CM | POA: Diagnosis not present

## 2018-04-04 DIAGNOSIS — F988 Other specified behavioral and emotional disorders with onset usually occurring in childhood and adolescence: Secondary | ICD-10-CM | POA: Diagnosis not present

## 2018-04-04 DIAGNOSIS — I10 Essential (primary) hypertension: Secondary | ICD-10-CM | POA: Diagnosis not present

## 2018-04-04 DIAGNOSIS — F17200 Nicotine dependence, unspecified, uncomplicated: Secondary | ICD-10-CM | POA: Diagnosis not present

## 2018-04-04 DIAGNOSIS — G8929 Other chronic pain: Secondary | ICD-10-CM | POA: Diagnosis not present

## 2018-05-05 DIAGNOSIS — M109 Gout, unspecified: Secondary | ICD-10-CM | POA: Diagnosis not present

## 2018-05-21 DIAGNOSIS — M109 Gout, unspecified: Secondary | ICD-10-CM | POA: Diagnosis not present

## 2018-05-21 DIAGNOSIS — Z6833 Body mass index (BMI) 33.0-33.9, adult: Secondary | ICD-10-CM | POA: Diagnosis not present

## 2018-05-21 DIAGNOSIS — M069 Rheumatoid arthritis, unspecified: Secondary | ICD-10-CM | POA: Diagnosis not present

## 2018-05-21 DIAGNOSIS — Z713 Dietary counseling and surveillance: Secondary | ICD-10-CM | POA: Diagnosis not present

## 2018-06-02 DIAGNOSIS — M109 Gout, unspecified: Secondary | ICD-10-CM | POA: Diagnosis not present

## 2018-06-02 DIAGNOSIS — M069 Rheumatoid arthritis, unspecified: Secondary | ICD-10-CM | POA: Diagnosis not present

## 2018-06-02 DIAGNOSIS — Z713 Dietary counseling and surveillance: Secondary | ICD-10-CM | POA: Diagnosis not present

## 2018-06-02 DIAGNOSIS — Z6834 Body mass index (BMI) 34.0-34.9, adult: Secondary | ICD-10-CM | POA: Diagnosis not present

## 2018-07-02 DIAGNOSIS — M85841 Other specified disorders of bone density and structure, right hand: Secondary | ICD-10-CM | POA: Diagnosis not present

## 2018-07-02 DIAGNOSIS — M069 Rheumatoid arthritis, unspecified: Secondary | ICD-10-CM | POA: Diagnosis not present

## 2018-07-02 DIAGNOSIS — M85842 Other specified disorders of bone density and structure, left hand: Secondary | ICD-10-CM | POA: Diagnosis not present

## 2018-07-02 DIAGNOSIS — M059 Rheumatoid arthritis with rheumatoid factor, unspecified: Secondary | ICD-10-CM | POA: Diagnosis not present

## 2018-07-21 DIAGNOSIS — M109 Gout, unspecified: Secondary | ICD-10-CM | POA: Diagnosis not present

## 2018-08-26 DIAGNOSIS — M069 Rheumatoid arthritis, unspecified: Secondary | ICD-10-CM | POA: Diagnosis not present

## 2018-08-26 DIAGNOSIS — Z1331 Encounter for screening for depression: Secondary | ICD-10-CM | POA: Diagnosis not present

## 2018-08-26 DIAGNOSIS — G8929 Other chronic pain: Secondary | ICD-10-CM | POA: Diagnosis not present

## 2018-08-26 DIAGNOSIS — F17200 Nicotine dependence, unspecified, uncomplicated: Secondary | ICD-10-CM | POA: Diagnosis not present
